# Patient Record
Sex: Male | Born: 1975 | Race: Black or African American | Hispanic: No | Marital: Married | Smoking: Current every day smoker
Health system: Southern US, Community
[De-identification: ages and names within clinical notes are randomized; demographics above are authoritative.]

## PROBLEM LIST (undated history)

## (undated) HISTORY — PX: FRACTURE SURGERY: SHX138

---

## 1998-09-16 ENCOUNTER — Emergency Department (HOSPITAL_COMMUNITY): Admission: EM | Admit: 1998-09-16 | Discharge: 1998-09-16 | Payer: Self-pay | Admitting: Emergency Medicine

## 1999-09-15 ENCOUNTER — Encounter: Payer: Self-pay | Admitting: Emergency Medicine

## 1999-09-15 ENCOUNTER — Emergency Department (HOSPITAL_COMMUNITY): Admission: EM | Admit: 1999-09-15 | Discharge: 1999-09-15 | Payer: Self-pay | Admitting: Emergency Medicine

## 2000-05-17 ENCOUNTER — Emergency Department (HOSPITAL_COMMUNITY): Admission: EM | Admit: 2000-05-17 | Discharge: 2000-05-17 | Payer: Self-pay | Admitting: Emergency Medicine

## 2000-05-17 ENCOUNTER — Encounter: Payer: Self-pay | Admitting: Emergency Medicine

## 2006-04-11 ENCOUNTER — Encounter: Payer: Self-pay | Admitting: Emergency Medicine

## 2007-09-03 ENCOUNTER — Emergency Department (HOSPITAL_COMMUNITY): Admission: EM | Admit: 2007-09-03 | Discharge: 2007-09-03 | Payer: Self-pay | Admitting: *Deleted

## 2008-01-29 ENCOUNTER — Emergency Department (HOSPITAL_COMMUNITY): Admission: EM | Admit: 2008-01-29 | Discharge: 2008-01-29 | Payer: Self-pay | Admitting: Emergency Medicine

## 2008-04-10 ENCOUNTER — Emergency Department (HOSPITAL_COMMUNITY): Admission: EM | Admit: 2008-04-10 | Discharge: 2008-04-10 | Payer: Self-pay | Admitting: Emergency Medicine

## 2008-04-17 ENCOUNTER — Emergency Department (HOSPITAL_COMMUNITY): Admission: EM | Admit: 2008-04-17 | Discharge: 2008-04-17 | Payer: Self-pay | Admitting: Emergency Medicine

## 2010-07-31 ENCOUNTER — Emergency Department (HOSPITAL_COMMUNITY): Admission: EM | Admit: 2010-07-31 | Discharge: 2010-07-31 | Payer: Self-pay | Admitting: Emergency Medicine

## 2014-06-17 ENCOUNTER — Emergency Department (HOSPITAL_COMMUNITY)
Admission: EM | Admit: 2014-06-17 | Discharge: 2014-06-17 | Disposition: A | Discharge status: Home / Self Care | Payer: Self-pay | Attending: Emergency Medicine | Admitting: Emergency Medicine

## 2014-06-17 ENCOUNTER — Emergency Department (HOSPITAL_COMMUNITY): Payer: Self-pay

## 2014-06-17 ENCOUNTER — Encounter (HOSPITAL_COMMUNITY): Payer: Self-pay | Admitting: Emergency Medicine

## 2014-06-17 DIAGNOSIS — IMO0002 Reserved for concepts with insufficient information to code with codable children: Secondary | ICD-10-CM | POA: Insufficient documentation

## 2014-06-17 DIAGNOSIS — S6990XA Unspecified injury of unspecified wrist, hand and finger(s), initial encounter: Principal | ICD-10-CM | POA: Insufficient documentation

## 2014-06-17 DIAGNOSIS — S6991XA Unspecified injury of right wrist, hand and finger(s), initial encounter: Secondary | ICD-10-CM

## 2014-06-17 DIAGNOSIS — Y9389 Activity, other specified: Secondary | ICD-10-CM | POA: Insufficient documentation

## 2014-06-17 DIAGNOSIS — W208XXA Other cause of strike by thrown, projected or falling object, initial encounter: Secondary | ICD-10-CM | POA: Insufficient documentation

## 2014-06-17 DIAGNOSIS — Y929 Unspecified place or not applicable: Secondary | ICD-10-CM | POA: Insufficient documentation

## 2014-06-17 DIAGNOSIS — Z23 Encounter for immunization: Secondary | ICD-10-CM | POA: Insufficient documentation

## 2014-06-17 DIAGNOSIS — F17200 Nicotine dependence, unspecified, uncomplicated: Secondary | ICD-10-CM | POA: Insufficient documentation

## 2014-06-17 DIAGNOSIS — S6980XA Other specified injuries of unspecified wrist, hand and finger(s), initial encounter: Secondary | ICD-10-CM | POA: Insufficient documentation

## 2014-06-17 MED ORDER — IBUPROFEN 800 MG PO TABS
800.0000 mg | ORAL_TABLET | Freq: Once | ORAL | Status: AC
  Administered 2014-06-17: 800 mg via ORAL
  Filled 2014-06-17: qty 1

## 2014-06-17 MED ORDER — TETANUS-DIPHTH-ACELL PERTUSSIS 5-2.5-18.5 LF-MCG/0.5 IM SUSP
0.5000 mL | Freq: Once | INTRAMUSCULAR | Status: AC
  Administered 2014-06-17: 0.5 mL via INTRAMUSCULAR
  Filled 2014-06-17: qty 0.5

## 2014-06-17 MED ORDER — IBUPROFEN 800 MG PO TABS: 800.0000 mg | ORAL_TABLET | Freq: Three times a day (TID) | ORAL | Status: DC

## 2014-06-17 NOTE — ED Notes (Signed)
Pt had R middle and ring finger hurt by alternator this am when it fell on hand. Pt has swelling to those 2 fingers along with abrasions. No injury to palm of hand or other fingers noted. Pt able to move fingers

## 2014-06-17 NOTE — ED Provider Notes (Signed)
CSN: [id number]     Arrival date & time 06/17/14  1308 History  This chart was scribed for non-physician practitioner, Coral CeoJessica Jennilyn Esteve PA-C, working with No att. providers found by Milly JakobJohn Lee Graves, ED Scribe. The patient was seen in room WTR9/WTR9. Patient's care was started at 2:01 PM.   Chief Complaint  Patient presents with  . Finger Injury   The history is provided by the patient. No language interpreter was used.    HPI Comments: Mason Calderon is a 38 y.o. male with no PMH who presents to the Emergency Department complaining of constant, right middle and right ring finger pain. He reports that he was working on his car at 1:00 AM this morning and an alternator fell on his hand. No other injuries or trauma. He reports that the pain is exacerbated by movement and palpation. He denies taking anything for the pain. No numbness, tingling, loss of sensation or weakness. Few minor abrasions with no open wounds or drainage. Tetanus not up to date.    History reviewed. No pertinent past medical history. History reviewed. No pertinent past surgical history. History reviewed. No pertinent family history. History  Substance Use Topics  . Smoking status: Current Every Day Smoker  . Smokeless tobacco: Not on file  . Alcohol Use: Yes    Review of Systems  Constitutional: Negative for fever and chills.  Musculoskeletal: Positive for arthralgias (right middle and ring finger).  Skin: Positive for wound (right middle and ring finger).  Neurological: Negative for weakness and numbness.    Allergies  Review of patient's allergies indicates no known allergies.  Home Medications   Prior to Admission medications   Not on File   Triage Vitals: BP 139/94  Pulse 76  Temp(Src) 98.6 F (37 C) (Oral)  Resp 16  SpO2 100%  Filed Vitals:   06/17/14 1328 06/17/14 1441  BP: 139/94 157/98  Pulse: 76 67  Temp: 98.6 F (37 C)   TempSrc: Oral   Resp: 16 16  SpO2: 100% 100%    Physical Exam   Nursing note and vitals reviewed. Constitutional: He is oriented to person, place, and time. He appears well-developed and well-nourished. No distress.  HENT:  Head: Normocephalic and atraumatic.  Right Ear: External ear normal.  Left Ear: External ear normal.  Mouth/Throat: Oropharynx is clear and moist.  Eyes: Conjunctivae are normal. Right eye exhibits no discharge. Left eye exhibits no discharge.  Neck: Neck supple.  Cardiovascular: Normal rate.   Radial pulses present. Capillary refill <2 seconds on digits of right hand.  Pulmonary/Chest: Effort normal.  Abdominal: Soft.  Musculoskeletal: Normal range of motion. He exhibits edema and tenderness.       Hands: Tenderness to palpation to the right 3rd and 4th digits throughout with mild associated edema throughout. No limitations with ROM of the digits.   Neurological: He is alert and oriented to person, place, and time.  Sensation intact in the digits of right hand  Skin: Skin is warm and dry. He is not diaphoretic.  Few minor abrasions to the 3rd and 4th digits. No open wounds or drainage. No erythema, warmth or ecchymosis    ED Course  Procedures (including critical care time) DIAGNOSTIC STUDIES: Oxygen Saturation is 100% on room air, normal by my interpretation.    COORDINATION OF CARE: 2:10 PM-Discussed treatment plan which includes Advil and cold compress with pt at bedside and pt agreed to plan.   Labs Review Labs Reviewed - No data to display  Imaging Review Dg Hand Complete Right  06/17/2014   CLINICAL DATA:  Right hand injury.  EXAM: RIGHT HAND - COMPLETE 3+ VIEW  COMPARISON:  None.  FINDINGS: There is no evidence of fracture or dislocation. There is no evidence of arthropathy or other focal bone abnormality. Soft tissues are unremarkable.  IMPRESSION: Negative.   Electronically Signed   By: Charlett NoseKevin  Dover M.D.   On: 06/17/2014 13:56     EKG Interpretation None      MDM   Mason Calderon is a 38 y.o. male  with no PMH who presents to the Emergency Department complaining of constant, right middle and right ring finger pain. Pain likely due to contusion. X-rays negative for fracture or malalignment. Patient neurovascularly intact. Wounds cleaned and antibiotic ointment applied. No lacerations to repair. Tetanus updated. Shoulder sling given to help with elevation. Also ice applied in ED. RICE method discussed. BP mildly elevated which I suspected is due to pain. Instructed patient to recheck this as an OP. Return precautions, discharge instructions, and follow-up was discussed with the patient before discharge.     Discharge Medication List as of 06/17/2014  2:20 PM    START taking these medications   Details  ibuprofen (ADVIL,MOTRIN) 800 MG tablet Take 1 tablet (800 mg total) by mouth 3 (three) times daily., Starting 06/17/2014, Until Discontinued, Print        Final impressions: 1. Injury of fourth finger, right, initial encounter   2. Injury of middle finger, right, initial encounter      Luiz IronJessica Katlin Athel Merriweather PA-C   I personally performed the services described in this documentation, which was scribed in my presence. The recorded information has been reviewed and is accurate.        Jillyn LedgerJessica K Kimia Finan, PA-C 06/18/14 [phone]

## 2014-06-17 NOTE — Discharge Instructions (Signed)
Take Ibuprofen to help with pain and swelling  Use RICE method - see below Keep wounds clean and dry - apply antibiotic ointment Return to the emergency department if you develop any changing/worsening condition, drainage, spreading redness/swelling, or any other concerns (please read additional information regarding your condition below)    Musculoskeletal Pain Musculoskeletal pain is muscle and boney aches and pains. These pains can occur in any part of the body. Your caregiver may treat you without knowing the cause of the pain. They may treat you if blood or urine tests, X-rays, and other tests were normal.  CAUSES There is often not a definite cause or reason for these pains. These pains may be caused by a type of germ (virus). The discomfort may also come from overuse. Overuse includes working out too hard when your body is not fit. Boney aches also come from weather changes. Bone is sensitive to atmospheric pressure changes. HOME CARE INSTRUCTIONS   Ask when your test results will be ready. Make sure you get your test results.  Only take over-the-counter or prescription medicines for pain, discomfort, or fever as directed by your caregiver. If you were given medications for your condition, do not drive, operate machinery or power tools, or sign legal documents for 24 hours. Do not drink alcohol. Do not take sleeping pills or other medications that may interfere with treatment.  Continue all activities unless the activities cause more pain. When the pain lessens, slowly resume normal activities. Gradually increase the intensity and duration of the activities or exercise.  During periods of severe pain, bed rest may be helpful. Lay or sit in any position that is comfortable.  Putting ice on the injured area.  Put ice in a bag.  Place a towel between your skin and the bag.  Leave the ice on for 15 to 20 minutes, 3 to 4 times a day.  Follow up with your caregiver for continued  problems and no reason can be found for the pain. If the pain becomes worse or does not go away, it may be necessary to repeat tests or do additional testing. Your caregiver may need to look further for a possible cause. SEEK IMMEDIATE MEDICAL CARE IF:  You have pain that is getting worse and is not relieved by medications.  You develop chest pain that is associated with shortness or breath, sweating, feeling sick to your stomach (nauseous), or throw up (vomit).  Your pain becomes localized to the abdomen.  You develop any new symptoms that seem different or that concern you. MAKE SURE YOU:   Understand these instructions.  Will watch your condition.  Will get help right away if you are not doing well or get worse. Document Released: 11/27/2005 Document Revised: 02/19/2012 Document Reviewed: 08/01/2013 [facility] Patient Information 2015 [place], [place]. This information is not intended to replace advice given to you by your health care provider. Make sure you discuss any questions you have with your health care provider.  RICE: Routine Care for Injuries The routine care of many injuries includes Rest, Ice, Compression, and Elevation (RICE). HOME CARE INSTRUCTIONS Rest is needed to allow your body to heal. Routine activities can usually be resumed when comfortable. Injured tendons and bones can take up to 6 weeks to heal. Tendons are the cord-like structures that attach muscle to bone. Ice following an injury helps keep the swelling down and reduces pain. Put ice in a plastic bag. Place a towel between your skin and the bag. Leave the  ice on for 15-20 minutes, 3-4 times a day, or as directed by your health care provider. Do this while awake, for the first 24 to 48 hours. After that, continue as directed by your caregiver. Compression helps keep swelling down. It also gives support and helps with discomfort. If an elastic bandage has been applied, it should be removed and reapplied every 3  to 4 hours. It should not be applied tightly, but firmly enough to keep swelling down. Watch fingers or toes for swelling, bluish discoloration, coldness, numbness, or excessive pain. If any of these problems occur, remove the bandage and reapply loosely. Contact your caregiver if these problems continue. Elevation helps reduce swelling and decreases pain. With extremities, such as the arms, hands, legs, and feet, the injured area should be placed near or above the level of the heart, if possible. SEEK IMMEDIATE MEDICAL CARE IF: You have persistent pain and swelling. You develop redness, numbness, or unexpected weakness. Your symptoms are getting worse rather than improving after several days. These symptoms may indicate that further evaluation or further X-rays are needed. Sometimes, X-rays may not show a small broken bone (fracture) until 1 week or 10 days later. Make a follow-up appointment with your caregiver. Ask when your X-ray results will be ready. Make sure you get your X-ray results. Document Released: 03/11/2001 Document Revised: 12/02/2013 Document Reviewed: 04/28/2011 [facility] Patient Information 2015 [place], [place]. This information is not intended to replace advice given to you by your health care provider. Make sure you discuss any questions you have with your health care provider.

## 2014-06-18 NOTE — ED Provider Notes (Signed)
Medical screening examination/treatment/procedure(s) were performed by non-physician practitioner and as supervising physician I was immediately available for consultation/collaboration.   EKG Interpretation None        Joya Gaskinsonald W Abbott Jasinski, MD 06/18/14 1921

## 2014-07-03 ENCOUNTER — Emergency Department (HOSPITAL_COMMUNITY)
Admission: EM | Admit: 2014-07-03 | Discharge: 2014-07-03 | Disposition: A | Discharge status: Home / Self Care | Payer: Self-pay | Attending: Emergency Medicine | Admitting: Emergency Medicine

## 2014-07-03 ENCOUNTER — Emergency Department (HOSPITAL_COMMUNITY): Payer: Self-pay

## 2014-07-03 ENCOUNTER — Encounter (HOSPITAL_COMMUNITY): Payer: Self-pay | Admitting: Emergency Medicine

## 2014-07-03 DIAGNOSIS — Z9889 Other specified postprocedural states: Secondary | ICD-10-CM | POA: Insufficient documentation

## 2014-07-03 DIAGNOSIS — Y9289 Other specified places as the place of occurrence of the external cause: Secondary | ICD-10-CM | POA: Insufficient documentation

## 2014-07-03 DIAGNOSIS — W230XXA Caught, crushed, jammed, or pinched between moving objects, initial encounter: Secondary | ICD-10-CM | POA: Insufficient documentation

## 2014-07-03 DIAGNOSIS — S62639A Displaced fracture of distal phalanx of unspecified finger, initial encounter for closed fracture: Secondary | ICD-10-CM | POA: Insufficient documentation

## 2014-07-03 DIAGNOSIS — S62502A Fracture of unspecified phalanx of left thumb, initial encounter for closed fracture: Secondary | ICD-10-CM

## 2014-07-03 DIAGNOSIS — S6980XA Other specified injuries of unspecified wrist, hand and finger(s), initial encounter: Secondary | ICD-10-CM | POA: Insufficient documentation

## 2014-07-03 DIAGNOSIS — S6990XA Unspecified injury of unspecified wrist, hand and finger(s), initial encounter: Secondary | ICD-10-CM | POA: Insufficient documentation

## 2014-07-03 DIAGNOSIS — F17200 Nicotine dependence, unspecified, uncomplicated: Secondary | ICD-10-CM | POA: Insufficient documentation

## 2014-07-03 DIAGNOSIS — Y9389 Activity, other specified: Secondary | ICD-10-CM | POA: Insufficient documentation

## 2014-07-03 MED ORDER — HYDROCODONE-ACETAMINOPHEN 5-325 MG PO TABS
1.0000 | ORAL_TABLET | Freq: Once | ORAL | Status: AC
  Administered 2014-07-03: 1 via ORAL
  Filled 2014-07-03: qty 1

## 2014-07-03 MED ORDER — HYDROCODONE-ACETAMINOPHEN 5-325 MG PO TABS: 1.0000 | ORAL_TABLET | Freq: Four times a day (QID) | ORAL | Status: DC | PRN

## 2014-07-03 NOTE — Discharge Instructions (Signed)
Your have a small fracture at the tip of your thumb You have been placed in a splint and given a referral to a hand surgeon to monitor healing

## 2014-07-03 NOTE — ED Provider Notes (Signed)
CSN: [id number]     Arrival date & time 07/03/14  0147 History   First MD Initiated Contact with Patient 07/03/14 0407     Chief Complaint  Patient presents with  . 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  Since that time.  He had pain and swelling.  Has been laceration to the skin.  He has not taken any medication or tried any treatment for his discomfort  The history is provided by the patient.    History reviewed. No pertinent past medical history. Past Surgical History  Procedure Laterality Date  . Fracture surgery     No family history on file. History  Substance Use Topics  . Smoking status: Current Every Day Smoker -- 0.50 packs/day    Types: Cigarettes  . Smokeless tobacco: Not on file  . Alcohol Use: Yes    Review of Systems  Constitutional: Negative for fever.  Musculoskeletal: Positive for joint swelling.  Neurological: Negative for numbness.  All other systems reviewed and are negative.     Allergies  Mushroom extract complex and Bee venom  Home Medications   Prior to Admission medications   Not on File   BP 161/99  Pulse 73  Temp(Src) 98.1 F (36.7 C) (Oral)  Resp 16  SpO2 97% Physical Exam  Nursing note and vitals reviewed. Constitutional: He appears well-developed and well-nourished.  HENT:  Head: Normocephalic.  Eyes: Pupils are equal, round, and reactive to light.  Neck: Normal range of motion.  Cardiovascular: Normal rate.   Pulmonary/Chest: Effort normal.  Musculoskeletal: He exhibits tenderness. He exhibits no edema.       Hands: Neurological: He is alert.  Skin: Skin is warm. No erythema.    ED Course  Procedures (including critical care time) Labs Review Labs Reviewed - No data to display  Imaging Review Dg Finger Thumb  Left  07/03/2014   CLINICAL DATA:  Thumb injury 3 days ago with persistent pain.  EXAM: LEFT THUMB 2+V  COMPARISON:  None.  FINDINGS: There is a nondisplaced fracture of the distal tuft of the thumb. No intra-articular extension identified. There is no evidence of foreign body.  IMPRESSION: Nondisplaced fracture of the distal phalanx of the left thumb.   Electronically Signed   By: Roxy HorsemanBill  Veazey M.D.   On: 07/03/2014 02:35     EKG Interpretation None      MDM  Small nondisplaced fracture of disatal tip of L thumb Will place in velcro splint  Final diagnoses:  None         Arman FilterGail K Rayma Hegg, NP 07/03/14 [phone]

## 2014-07-03 NOTE — ED Provider Notes (Signed)
Medical screening examination/treatment/procedure(s) were performed by non-physician practitioner and as supervising physician I was immediately available for consultation/collaboration.   EKG Interpretation None        Hoby Kawai, MD 07/03/14 0639 

## 2014-07-03 NOTE — ED Notes (Signed)
Per pt report: pt smashed his left thumb in between two pieces of metal on Tuesday. Pt reports pain has increased.  Pt able to bend thumb and no obvious deformity noted.

## 2015-07-16 ENCOUNTER — Encounter (HOSPITAL_COMMUNITY): Payer: Self-pay

## 2015-07-16 ENCOUNTER — Emergency Department (HOSPITAL_COMMUNITY): Payer: Self-pay

## 2015-07-16 ENCOUNTER — Emergency Department (HOSPITAL_COMMUNITY)
Admission: EM | Admit: 2015-07-16 | Discharge: 2015-07-16 | Disposition: A | Discharge status: Home / Self Care | Payer: Self-pay | Attending: Emergency Medicine | Admitting: Emergency Medicine

## 2015-07-16 DIAGNOSIS — S3992XA Unspecified injury of lower back, initial encounter: Secondary | ICD-10-CM | POA: Insufficient documentation

## 2015-07-16 DIAGNOSIS — Z720 Tobacco use: Secondary | ICD-10-CM | POA: Insufficient documentation

## 2015-07-16 DIAGNOSIS — X58XXXA Exposure to other specified factors, initial encounter: Secondary | ICD-10-CM | POA: Insufficient documentation

## 2015-07-16 DIAGNOSIS — Y9389 Activity, other specified: Secondary | ICD-10-CM | POA: Insufficient documentation

## 2015-07-16 DIAGNOSIS — M545 Low back pain, unspecified: Secondary | ICD-10-CM

## 2015-07-16 DIAGNOSIS — Y998 Other external cause status: Secondary | ICD-10-CM | POA: Insufficient documentation

## 2015-07-16 DIAGNOSIS — S73101A Unspecified sprain of right hip, initial encounter: Secondary | ICD-10-CM | POA: Insufficient documentation

## 2015-07-16 DIAGNOSIS — Y9289 Other specified places as the place of occurrence of the external cause: Secondary | ICD-10-CM | POA: Insufficient documentation

## 2015-07-16 MED ORDER — METHOCARBAMOL 500 MG PO TABS: 500.0000 mg | ORAL_TABLET | Freq: Two times a day (BID) | ORAL | Status: AC

## 2015-07-16 MED ORDER — DICLOFENAC SODIUM 75 MG PO TBEC: 75.0000 mg | DELAYED_RELEASE_TABLET | Freq: Two times a day (BID) | ORAL | Status: AC

## 2015-07-16 NOTE — ED Notes (Signed)
Pt ambulatory with steady gait returned from radiology

## 2015-07-16 NOTE — Discharge Instructions (Signed)

## 2015-07-16 NOTE — ED Notes (Signed)
Patient states he was helping a relative go up stairs yesterday and felt his right hip "pop". Patient states he continues to have pain in his right lower back and right hip. Patient states today he took a step and fell to the ground.

## 2015-07-16 NOTE — ED Provider Notes (Signed)
CSN: [id number]     Arrival date & time 07/16/15  1116 History   This chart was scribed for non-physician practitioner, Langston Masker, PA-C working with Lyndal Pulley, MD, by Jarvis Morgan, ED Scribe. This patient was seen in room WTR6/WTR6 and the patient's care was started at 12:05 PM.    Chief Complaint  Patient presents with  . Fall  . Hip Pain  . Back Pain    Patient is a 39 y.o. male presenting with hip pain and back pain.  Hip Pain This is a new problem. The current episode started yesterday. The problem occurs constantly. The problem has not changed since onset.Pertinent negatives include no chest pain, no abdominal pain, no headaches and no shortness of breath. The symptoms are aggravated by walking and twisting. Nothing relieves the symptoms. He has tried nothing for the symptoms.  Back Pain Associated symptoms: no abdominal pain, no chest pain and no headaches     HPI Comments: Mason Calderon is a 38 y.o. male who presents to the Emergency Department complaining of constant, moderate, right hip pain onset yesterday. Pt states he was helping a relative that is wheel chair bound go up the stairs and felt a "pop" in his right hip. Pt states the pain is radiating up into his right lower back. Pt is able to ambulate but is having to use a cane. He denise any prior h/o hip pain or back pain. He has not taken any medication for the pain. Pt denies any other complaints at this time.      History reviewed. No pertinent past medical history. Past Surgical History  Procedure Laterality Date  . Fracture surgery     History reviewed. No pertinent family history. History  Substance Use Topics  . Smoking status: Current Every Day Smoker -- 0.50 packs/day    Types: Cigarettes  . Smokeless tobacco: Never Used  . Alcohol Use: Yes     Comment: occasionally    Review of Systems  Respiratory: Negative for shortness of breath.   Cardiovascular: Negative for chest pain.   Gastrointestinal: Negative for abdominal pain.  Musculoskeletal: Positive for myalgias, back pain and arthralgias.  Neurological: Negative for headaches.  All other systems reviewed and are negative.     Allergies  Mushroom extract complex and Bee venom  Home Medications   Prior to Admission medications   Medication Sig Start Date End Date Taking? Authorizing Provider  HYDROcodone-acetaminophen (NORCO/VICODIN) 5-325 MG per tablet Take 1 tablet by mouth every 6 (six) hours as needed. Patient not taking: Reported on 07/16/2015 07/03/14   Earley Favor, NP   Triage Vitals: BP 152/104 mmHg  Pulse 89  Temp(Src) 98 F (36.7 C) (Oral)  Resp 20  SpO2 100%  Physical Exam  Constitutional: He is oriented to person, place, and time. He appears well-developed and well-nourished. No distress.  HENT:  Head: Normocephalic and atraumatic.  Eyes: Conjunctivae and EOM are normal.  Neck: Neck supple. No tracheal deviation present.  Cardiovascular: Normal rate.   Pulmonary/Chest: Effort normal. No respiratory distress.  Musculoskeletal: Normal range of motion. He exhibits tenderness.  Tender mid back to lumbar, tender to right hip  Neurological: He is alert and oriented to person, place, and time.  Skin: Skin is warm and dry.  Psychiatric: He has a normal mood and affect. His behavior is normal.  Nursing note and vitals reviewed.   ED Course  Procedures (including critical care time)  DIAGNOSTIC STUDIES: Oxygen Saturation is 100% on RA, normal  by my interpretation.    COORDINATION OF CARE:    Labs Review Labs Reviewed - No data to display  Imaging Review Dg Lumbar Spine Complete  07/16/2015   CLINICAL DATA:  39 year old male with acute onset pain radiating to the right hip after assisting someone in a wheelchair. Symptoms for 1 day. Initial encounter.  EXAM: LUMBAR SPINE - COMPLETE 4+ VIEW  COMPARISON:  None.  FINDINGS: Mildly transitional anatomy. Mostly sacralized S1 level with  sclerotic bilateral assimilation joints and suspected vestigial S1-S2 disc space. Straightening of lumbar lordosis. Lumbar disc spaces are preserved except for L5-S1 with mild narrowing and and endplate spurring. No pars fracture. SI joints appear within normal limits. Visualized lower thoracic levels appear intact. Evidence of mild calcified atherosclerosis of the abdominal aorta.  IMPRESSION: 1. No acute osseous abnormality identified in the lumbar spine Mild evidence of disc and endplate degeneration at L5-S1. 2. Evidence of calcified aortic atherosclerosis.   Electronically Signed   By: Odessa Fleming M.D.   On: 07/16/2015 13:20   Dg Hip Unilat With Pelvis 2-3 Views Right  07/16/2015   CLINICAL DATA:  Acute onset severe pain while assisting with wheelchair transfer  EXAM: DG HIP (WITH OR WITHOUT PELVIS) 2-3V RIGHT  COMPARISON:  None.  FINDINGS: Frontal pelvis as well as frontal and lateral right hip images were obtained. There is no fracture or dislocation. Joint spaces appear intact. No erosive change. There is an unfused apophysis along the superior lateral left acetabulum.  IMPRESSION: No fracture or dislocation.  No appreciable joint space narrowing.   Electronically Signed   By: Bretta Bang III M.D.   On: 07/16/2015 13:20     EKG Interpretation None      MDM   Final diagnoses:  Sprain of right hip, initial encounter  Right-sided low back pain without sciatica    Robaxin voltaren avs Schedule to see the Orthopaedist for recheck in 1 week if pain persist   Elson Areas, PA-C 07/16/15 [address] [place], PA-C 07/16/15 1349  Lyndal Pulley, MD 07/16/15 [phone]

## 2015-08-06 ENCOUNTER — Emergency Department (HOSPITAL_COMMUNITY): Payer: Self-pay

## 2015-08-06 ENCOUNTER — Encounter (HOSPITAL_COMMUNITY): Payer: Self-pay | Admitting: Emergency Medicine

## 2015-08-06 ENCOUNTER — Emergency Department (HOSPITAL_COMMUNITY)
Admission: EM | Admit: 2015-08-06 | Discharge: 2015-08-06 | Disposition: A | Discharge status: Home / Self Care | Payer: Self-pay | Attending: Emergency Medicine | Admitting: Emergency Medicine

## 2015-08-06 DIAGNOSIS — Y9389 Activity, other specified: Secondary | ICD-10-CM | POA: Insufficient documentation

## 2015-08-06 DIAGNOSIS — Z79899 Other long term (current) drug therapy: Secondary | ICD-10-CM | POA: Insufficient documentation

## 2015-08-06 DIAGNOSIS — Z720 Tobacco use: Secondary | ICD-10-CM | POA: Insufficient documentation

## 2015-08-06 DIAGNOSIS — Y9289 Other specified places as the place of occurrence of the external cause: Secondary | ICD-10-CM | POA: Insufficient documentation

## 2015-08-06 DIAGNOSIS — S51832A Puncture wound without foreign body of left forearm, initial encounter: Secondary | ICD-10-CM

## 2015-08-06 DIAGNOSIS — Z23 Encounter for immunization: Secondary | ICD-10-CM | POA: Insufficient documentation

## 2015-08-06 DIAGNOSIS — W228XXA Striking against or struck by other objects, initial encounter: Secondary | ICD-10-CM | POA: Insufficient documentation

## 2015-08-06 DIAGNOSIS — Y998 Other external cause status: Secondary | ICD-10-CM | POA: Insufficient documentation

## 2015-08-06 DIAGNOSIS — Z791 Long term (current) use of non-steroidal anti-inflammatories (NSAID): Secondary | ICD-10-CM | POA: Insufficient documentation

## 2015-08-06 MED ORDER — HYDROCODONE-ACETAMINOPHEN 5-325 MG PO TABS: 2.0000 | ORAL_TABLET | ORAL | Status: AC | PRN

## 2015-08-06 MED ORDER — HYDROCODONE-ACETAMINOPHEN 5-325 MG PO TABS
2.0000 | ORAL_TABLET | Freq: Once | ORAL | Status: AC
  Administered 2015-08-06: 2 via ORAL
  Filled 2015-08-06: qty 2

## 2015-08-06 MED ORDER — TETANUS-DIPHTH-ACELL PERTUSSIS 5-2.5-18.5 LF-MCG/0.5 IM SUSP
0.5000 mL | Freq: Once | INTRAMUSCULAR | Status: AC
Start: 2015-08-06 — End: 2015-08-06
  Administered 2015-08-06: 0.5 mL via INTRAMUSCULAR
  Filled 2015-08-06: qty 0.5

## 2015-08-06 MED ORDER — SULFAMETHOXAZOLE-TRIMETHOPRIM 800-160 MG PO TABS: 1.0000 | ORAL_TABLET | Freq: Two times a day (BID) | ORAL | Status: AC

## 2015-08-06 MED ORDER — AMOXICILLIN-POT CLAVULANATE 875-125 MG PO TABS
1.0000 | ORAL_TABLET | Freq: Once | ORAL | Status: AC
  Administered 2015-08-06: 1 via ORAL
  Filled 2015-08-06: qty 1
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
## 2015-08-06 NOTE — Discharge Instructions (Signed)
Puncture Wound °A puncture wound is an injury that extends through all layers of the skin and into the tissue beneath the skin (subcutaneous tissue). Puncture wounds become infected easily because germs often enter the body and go beneath the skin during the injury. Having a deep wound with a small entrance point makes it difficult for your caregiver to adequately clean the wound. This is especially true if you have stepped on a nail and it has passed through a dirty shoe or other situations where the wound is obviously contaminated. °CAUSES  °Many puncture wounds involve glass, nails, splinters, fish hooks, or other objects that enter the skin (foreign bodies). A puncture wound may also be caused by a human bite or animal bite. °DIAGNOSIS  °A puncture wound is usually diagnosed by your history and a physical exam. You may need to have an X-ray or an ultrasound to check for any foreign bodies still in the wound. °TREATMENT  °· Your caregiver will clean the wound as thoroughly as possible. Depending on the location of the wound, a bandage (dressing) may be applied. °· Your caregiver might prescribe antibiotic medicines. °· You may need a follow-up visit to check on your wound. Follow all instructions as directed by your caregiver. °HOME CARE INSTRUCTIONS  °· Change your dressing once per day, or as directed by your caregiver. If the dressing sticks, it may be removed by soaking the area in water. °· If your caregiver has given you follow-up instructions, it is very important that you return for a follow-up appointment. Not following up as directed could result in a chronic or permanent injury, pain, and disability. °· Only take over-the-counter or prescription medicines for pain, discomfort, or fever as directed by your caregiver. °· If you are given antibiotics, take them as directed. Finish them even if you start to feel better. °You may need a tetanus shot if: °· You cannot remember when you had your last tetanus  shot. °· You have never had a tetanus shot. °If you got a tetanus shot, your arm may swell, get red, and feel warm to the touch. This is common and not a problem. If you need a tetanus shot and you choose not to have one, there is a rare chance of getting tetanus. Sickness from tetanus can be serious. °You may need a rabies shot if an animal bite caused your puncture wound. °SEEK MEDICAL CARE IF:  °· You have redness, swelling, or increasing pain in the wound. °· You have red streaks going away from the wound. °· You notice a bad smell coming from the wound or dressing. °· You have yellowish-white fluid (pus) coming from the wound. °· You are treated with an antibiotic for infection, but the infection is not getting better. °· You notice something in the wound, such as rubber from your shoe, cloth, or another object. °· You have a fever. °· You have severe pain. °· You have difficulty breathing. °· You feel dizzy or faint. °· You cannot stop vomiting. °· You lose feeling, develop numbness, or cannot move a limb below the wound. °· Your symptoms worsen. °MAKE SURE YOU: °· Understand these instructions. °· Will watch your condition. °· Will get help right away if you are not doing well or get worse. °Document Released: 09/06/2005 Document Revised: 02/19/2012 Document Reviewed: 05/16/2011 °Exit[employer]® Patient Information ©2015 [employer]. This information is not intended to replace advice given to you by your health care provider. Make sure you discuss any questions you   have with your health care provider. ° °

## 2016-07-28 ENCOUNTER — Emergency Department (HOSPITAL_COMMUNITY)
Admission: EM | Admit: 2016-07-28 | Discharge: 2016-07-28 | Disposition: A | Discharge status: Home / Self Care | Payer: Self-pay | Attending: Emergency Medicine | Admitting: Emergency Medicine

## 2016-07-28 ENCOUNTER — Encounter (HOSPITAL_COMMUNITY): Payer: Self-pay

## 2016-07-28 DIAGNOSIS — Z791 Long term (current) use of non-steroidal anti-inflammatories (NSAID): Secondary | ICD-10-CM | POA: Insufficient documentation

## 2016-07-28 DIAGNOSIS — K029 Dental caries, unspecified: Secondary | ICD-10-CM | POA: Insufficient documentation

## 2016-07-28 DIAGNOSIS — Z79899 Other long term (current) drug therapy: Secondary | ICD-10-CM | POA: Insufficient documentation

## 2016-07-28 DIAGNOSIS — F17210 Nicotine dependence, cigarettes, uncomplicated: Secondary | ICD-10-CM | POA: Insufficient documentation

## 2016-07-28 DIAGNOSIS — F1290 Cannabis use, unspecified, uncomplicated: Secondary | ICD-10-CM | POA: Insufficient documentation

## 2016-07-28 MED ORDER — KETOROLAC TROMETHAMINE 30 MG/ML IJ SOLN: 30.0000 mg | Freq: Once | INTRAMUSCULAR | Status: DC

## 2016-07-28 MED ORDER — ACETAMINOPHEN 325 MG PO TABS
650.0000 mg | ORAL_TABLET | Freq: Once | ORAL | Status: AC
  Administered 2016-07-28: 650 mg via ORAL
  Filled 2016-07-28: qty 2

## 2016-07-28 MED ORDER — PENICILLIN V POTASSIUM 500 MG PO TABS: 500.0000 mg | ORAL_TABLET | Freq: Four times a day (QID) | ORAL | 0 refills | Status: AC

## 2016-07-28 MED ORDER — BUPIVACAINE-EPINEPHRINE (PF) 0.5% -1:200000 IJ SOLN: 1.8000 mL | Freq: Once | INTRAMUSCULAR | Status: DC

## 2016-07-28 MED ORDER — KETOROLAC TROMETHAMINE 30 MG/ML IJ SOLN
30.0000 mg | Freq: Once | INTRAMUSCULAR | Status: AC
  Administered 2016-07-28: 30 mg via INTRAMUSCULAR
  Filled 2016-07-28: qty 1

## 2016-07-28 MED ORDER — NAPROXEN 500 MG PO TABS: 500.0000 mg | ORAL_TABLET | Freq: Two times a day (BID) | ORAL | 0 refills | Status: AC
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
## 2016-07-28 NOTE — ED Provider Notes (Signed)
[facility]-EMERGENCY DEPT Provider Note   CSN: [id number] Arrival date & time: 07/28/16  1858  By signing my name below, I, Mason Calderon, attest that this documentation has been prepared under the direction and in the presence of Mason Calderon, [place]. Electronically Signed: Javier Dockerobert Ryan Calderon, ER Scribe. 07/22/2016. 7:39 PM.   History   Chief Complaint Chief Complaint  Patient presents with  . Dental Pain    LEFT UPPER   The history is provided by the patient. No language interpreter was used.    HPI Comments: Mason Calderon is a 40 y.o. male who presents to the Emergency Department complaining of left-sided tooth pain for three days. He woke up today from a nap and his face was swollen on the left side. Denies fever or chills. States he has several broken teeth that he is aware of. Has not tried anything for the pain. He states he has a Education officer, communitydentist but has not tried to contact him because he thought the toothache would go away.   History reviewed. No pertinent past medical history.  There are no active problems to display for this patient.   Past Surgical History:  Procedure Laterality Date  . FRACTURE SURGERY       Home Medications    Prior to Admission medications   Medication Sig Start Date End Date Taking? Authorizing Provider  diclofenac (VOLTAREN) 75 MG EC tablet Take 1 tablet (75 mg total) by mouth 2 (two) times daily. Patient not taking: Reported on 08/06/2015 07/16/15   Elson AreasLeslie K Sofia, PA-C  HYDROcodone-acetaminophen (NORCO/VICODIN) 5-325 MG per tablet Take 2 tablets by mouth every 4 (four) hours as needed. 08/06/15   Elson AreasLeslie K Sofia, PA-C  methocarbamol (ROBAXIN) 500 MG tablet Take 1 tablet (500 mg total) by mouth 2 (two) times daily. Patient not taking: Reported on 08/06/2015 07/16/15   Elson AreasLeslie K Sofia, PA-C    Family History Family History  Problem Relation Age of Onset  . Diabetes Mother   . Hypertension Mother   . Hypertension Father   . Diabetes Father     Social  History Social History  Substance Use Topics  . Smoking status: Current Every Day Smoker    Packs/day: 0.50    Types: Cigarettes  . Smokeless tobacco: Never Used  . Alcohol use Yes     Comment: occasionally     Allergies   Mushroom extract complex and Bee venom   Review of Systems Review of Systems At least 10pt or greater review of systems completed and are negative except where specified in the HPI.  Physical Exam Updated Vital Signs BP 115/81 (BP Location: Left Arm)   Pulse 89   Temp 99.7 F (37.6 C) (Oral)   Resp 16   Ht 6\' 3"  (1.905 m)   Wt 240 lb (108.9 kg)   SpO2 100%   BMI 30.00 kg/m   Physical Exam  Constitutional: He is oriented to person, place, and time. He appears well-developed and well-nourished. No distress.  HENT:  Head: Normocephalic and atraumatic.  Generally poor dentition with several broken and decaying teeth. Left upper quadrant diffusely tender to palpation. No gross abscess. Uvula midline No trismus Left maxillary facial area tender to palpation with some mild edema no erythema No cervical LAD  Eyes: Pupils are equal, round, and reactive to light.  Neck: Neck supple.  Cardiovascular: Normal rate.   Pulmonary/Chest: Effort normal. No respiratory distress.  Musculoskeletal: Normal range of motion.  Neurological: He is alert and oriented to  person, place, and time. Coordination normal.  Skin: Skin is warm and dry. He is not diaphoretic.  Psychiatric: He has a normal mood and affect. His behavior is normal.  Nursing note and vitals reviewed.  ED Treatments / Results  DIAGNOSTIC STUDIES: Oxygen Saturation is 100% on RA, normal by my interpretation.     Labs (all labs ordered are listed, but only abnormal results are displayed) Labs Reviewed - No data to display  EKG  EKG Interpretation None       Radiology No results found.  Procedures Procedures (including critical care time)  Medications Ordered in ED Medications    ketorolac (TORADOL) 30 MG/ML injection 30 mg (30 mg Intramuscular Given 07/28/16 1958)  acetaminophen (TYLENOL) tablet 650 mg (650 mg Oral Given 07/28/16 1958)     Initial Impression / Assessment and Plan / ED Course  I have reviewed the triage vital signs and the nursing notes.  Pertinent labs & imaging results that were available during my care of the patient were reviewed by me and considered in my medical decision making (see chart for details).  Clinical Course    Patient with dentalgia.  No abscess requiring immediate incision and drainage.  Exam not concerning for Ludwig's angina or pharyngeal abscess.  Will treat with Pen VK and naproxen. Low grade fever but otherwise hemodynamically stable, no airway involvement. Pt instructed to follow-up with dentist.  Resource guide given. Discussed return precautions. Pt safe for discharge.   Final Clinical Impressions(s) / ED Diagnoses   Final diagnoses:  Pain due to dental caries    New Prescriptions Discharge Medication List as of 07/28/2016  8:06 PM    START taking these medications   Details  naproxen (NAPROSYN) 500 MG tablet Take 1 tablet (500 mg total) by mouth 2 (two) times daily., Starting Fri 07/28/2016, Print    penicillin v potassium (VEETID) 500 MG tablet Take 1 tablet (500 mg total) by mouth 4 (four) times daily., Starting Fri 07/28/2016, Until Fri 08/04/2016, Print        I personally performed the services described in this documentation, which was scribed in my presence. The recorded information has been reviewed and is accurate.    Carlene CoriaSerena Y Antoinette Haskett, PA-C 07/28/16 2132    Pricilla LovelessScott Goldston, MD 07/29/16 [phone]

## 2016-07-28 NOTE — Discharge Instructions (Signed)
Take antibiotics as prescribed. Take naproxen as needed for pain. Call the dental clinics listed in the attached guide.
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
## 2023-12-01 ENCOUNTER — Encounter (HOSPITAL_COMMUNITY): Payer: Self-pay

## 2023-12-01 ENCOUNTER — Other Ambulatory Visit: Payer: Self-pay

## 2023-12-01 ENCOUNTER — Emergency Department (HOSPITAL_COMMUNITY)
Admission: EM | Admit: 2023-12-01 | Discharge: 2023-12-01 | Disposition: A | Discharge status: Home / Self Care | Payer: 59 | Attending: Emergency Medicine | Admitting: Emergency Medicine

## 2023-12-01 DIAGNOSIS — S01411A Laceration without foreign body of right cheek and temporomandibular area, initial encounter: Secondary | ICD-10-CM | POA: Diagnosis not present

## 2023-12-01 DIAGNOSIS — E669 Obesity, unspecified: Secondary | ICD-10-CM | POA: Diagnosis not present

## 2023-12-01 DIAGNOSIS — Z23 Encounter for immunization: Secondary | ICD-10-CM | POA: Diagnosis not present

## 2023-12-01 DIAGNOSIS — Z6830 Body mass index (BMI) 30.0-30.9, adult: Secondary | ICD-10-CM | POA: Insufficient documentation

## 2023-12-01 DIAGNOSIS — W228XXA Striking against or struck by other objects, initial encounter: Secondary | ICD-10-CM | POA: Insufficient documentation

## 2023-12-01 DIAGNOSIS — S0181XA Laceration without foreign body of other part of head, initial encounter: Secondary | ICD-10-CM | POA: Diagnosis not present

## 2023-12-01 MED ORDER — BACITRACIN ZINC 500 UNIT/GM EX OINT
TOPICAL_OINTMENT | CUTANEOUS | Status: AC
  Filled 2023-12-01: qty 1.8

## 2023-12-01 MED ORDER — BACITRACIN ZINC 500 UNIT/GM EX OINT
TOPICAL_OINTMENT | CUTANEOUS | Status: AC
  Administered 2023-12-01: 1
  Filled 2023-12-01: qty 0.9

## 2023-12-01 MED ORDER — LIDOCAINE-EPINEPHRINE (PF) 2 %-1:200000 IJ SOLN
20.0000 mL | Freq: Once | INTRAMUSCULAR | Status: AC
  Administered 2023-12-01: 20 mL
  Filled 2023-12-01: qty 20

## 2023-12-01 MED ORDER — TETANUS-DIPHTH-ACELL PERTUSSIS 5-2.5-18.5 LF-MCG/0.5 IM SUSY
0.5000 mL | PREFILLED_SYRINGE | Freq: Once | INTRAMUSCULAR | Status: AC
  Administered 2023-12-01: 0.5 mL via INTRAMUSCULAR
  Filled 2023-12-01 (×2): qty 0.5

## 2023-12-01 NOTE — ED Provider Notes (Signed)
[place] EMERGENCY DEPARTMENT AT [facility] Provider Note   CSN: [id number] Arrival date & time: 12/01/23  2536     History  Chief Complaint  Patient presents with   Facial Injury    Mason Calderon is a 47 y.o. male.  HPI     This is a 47 year old with a laceration to the right cheek.  He overall nontoxic-appearing.  ABCs are intact.  Patient reports that he was trying to get into his car when he slipped and his right cheek hit the door.  He sustained a laceration.  He had glasses on no loss of consciousness.  No blood thinners.  Home Medications Prior to Admission medications   Medication Sig Start Date End Date Taking? Authorizing Provider  diclofenac (VOLTAREN) 75 MG EC tablet Take 1 tablet (75 mg total) by mouth 2 (two) times daily. Patient not taking: Reported on 08/06/2015 07/16/15   Elson Areas, PA-C  HYDROcodone-acetaminophen (NORCO/VICODIN) 5-325 MG per tablet Take 2 tablets by mouth every 4 (four) hours as needed. 08/06/15   Elson Areas, PA-C  methocarbamol (ROBAXIN) 500 MG tablet Take 1 tablet (500 mg total) by mouth 2 (two) times daily. Patient not taking: Reported on 08/06/2015 07/16/15   Elson Areas, PA-C  naproxen (NAPROSYN) 500 MG tablet Take 1 tablet (500 mg total) by mouth 2 (two) times daily. 07/28/16   Eliseo Squires, PA-C      Allergies    Mushroom extract complex (do not select) and Bee venom    Review of Systems   Review of Systems  Skin:  Positive for wound.  All other systems reviewed and are negative.   Physical Exam Updated Vital Signs BP (!) 165/96   Pulse 98   Temp 98.3 F (36.8 C) (Oral)   Resp 18   Ht 1.905 m (6\' 3" )   Wt 108.9 kg   SpO2 99%   BMI 30.01 kg/m  Physical Exam Vitals and nursing note reviewed.  Constitutional:      Appearance: He is well-developed. He is obese. He is not ill-appearing.  HENT:     Head: Normocephalic.  Eyes:     Extraocular Movements: Extraocular movements intact.      Pupils: Pupils are equal, round, and reactive to light.      Comments: 1.5 cm slightly gaping laceration just inferior and lateral to the right orbit, slight swelling noted No injected conjunctiva vision grossly intact, extraocular movements intact  Cardiovascular:     Rate and Rhythm: Normal rate and regular rhythm.  Pulmonary:     Effort: Pulmonary effort is normal. No respiratory distress.  Abdominal:     Palpations: Abdomen is soft.  Musculoskeletal:     Cervical back: Neck supple.  Lymphadenopathy:     Cervical: No cervical adenopathy.  Skin:    General: Skin is warm and dry.  Neurological:     Mental Status: He is alert and oriented to person, place, and time.  Psychiatric:        Mood and Affect: Mood normal.     ED Results / Procedures / Treatments   Labs (all labs ordered are listed, but only abnormal results are displayed) Labs Reviewed - No data to display  EKG None  Radiology No results found.  Procedures .Laceration Repair  Date/Time: 12/01/2023 6:03 AM  Performed by: Shon Baton, MD Authorized by: Shon Baton, MD   Consent:    Consent obtained:  Verbal   Consent given  by:  Patient   Risks, benefits, and alternatives were discussed: yes     Risks discussed:  Infection, pain and poor cosmetic result   Alternatives discussed:  No treatment Anesthesia:    Anesthesia method:  Local infiltration   Local anesthetic:  Lidocaine 2% WITH epi Laceration details:    Location:  Face   Face location:  R cheek   Length (cm):  1.5   Depth (mm):  3 Exploration:    Limited defect created (wound extended): no     Hemostasis achieved with:  Direct pressure   Contaminated: no   Treatment:    Area cleansed with:  Povidone-iodine   Amount of cleaning:  Standard   Irrigation solution:  Sterile saline   Visualized foreign bodies/material removed: no     Debridement:  None   Undermining:  None   Scar revision: no   Skin repair:    Repair method:   Sutures   Suture size:  5-0   Suture material:  Fast-absorbing gut   Suture technique:  Simple interrupted   Number of sutures:  3 Approximation:    Approximation:  Close Repair type:    Repair type:  Simple Post-procedure details:    Dressing:  Open (no dressing)   Procedure completion:  Tolerated     Medications Ordered in ED Medications  lidocaine-EPINEPHrine (XYLOCAINE W/EPI) 2 %-1:200000 (PF) injection 20 mL (20 mLs Infiltration Given 12/01/23 0546)  Tdap (BOOSTRIX) injection 0.5 mL (0.5 mLs Intramuscular Given 12/01/23 0545)    ED Course/ Medical Decision Making/ A&P                                 Medical Decision Making Risk Prescription drug management.   This patient presents to the ED for concern of laceration, this involves an extensive number of treatment options, and is a complaint that carries with it a high risk of complications and morbidity.  I considered the following differential and admission for this acute, potentially life threatening condition.  The differential diagnosis includes laceration, contusion, orbital injury, fracture  MDM:    This is a 47 year old male who presents with laceration to the right cheek just adjacent and inferior to the right eye.  Slight swelling noted.  No palpable underlying defects, extraocular movements intact and patient endorses normal vision.  No obvious orbital trauma.  Laceration repaired at bedside.  Do not feel he needs imaging at this time as I have low suspicion for fracture or head injury.  (Labs, imaging, consults)  Labs: I Ordered, and personally interpreted labs.  The pertinent results include: None  Imaging Studies ordered: I ordered imaging studies including none I independently visualized and interpreted imaging. I agree with the radiologist interpretation  Additional history obtained from chart review.  External records from outside source obtained and reviewed including prior evaluations  Cardiac  Monitoring: The patient was not maintained on a cardiac monitor.  If on the cardiac monitor, I personally viewed and interpreted the cardiac monitored which showed an underlying rhythm of: N/A  Reevaluation: After the interventions noted above, I reevaluated the patient and found that they have :improved  Social Determinants of Health:  lives independently  Disposition: Discharge  Co morbidities that complicate the patient evaluation History reviewed. No pertinent past medical history.   Medicines Meds ordered this encounter  Medications   lidocaine-EPINEPHrine (XYLOCAINE W/EPI) 2 %-1:200000 (PF) injection 20 mL   Tdap (BOOSTRIX) injection  0.5 mL   bacitracin 500 UNIT/GM ointment    Carolin Coy, Gabriela: cabinet override    I have reviewed the patients home medicines and have made adjustments as needed  Problem List / ED Course: Problem List Items Addressed This Visit   None Visit Diagnoses       Facial laceration, initial encounter    -  Primary                   Final Clinical Impression(s) / ED Diagnoses Final diagnoses:  Facial laceration, initial encounter    Rx / DC Orders ED Discharge Orders     None         Shon Baton, MD 12/01/23 [phone]

## 2023-12-01 NOTE — ED Triage Notes (Addendum)
Pt reports with a under right eye small laceration from a car door. Pt denies falling. Pt has been drinking tonight. No vision issues.

## 2023-12-01 NOTE — Discharge Instructions (Signed)
You were seen today and have a laceration of the right cheek just under the eye.  Absorbable sutures were placed.  Keep moist with bacitracin ointment.  Sutures will loosen on their own.  Monitor for signs and symptoms of infection including redness.  Once healed, make sure to use sunscreen to prevent significant discoloration.

## 2023-12-17 ENCOUNTER — Telehealth: Payer: Self-pay

## 2023-12-17 NOTE — Progress Notes (Signed)
 Transition Care Management Unsuccessful Follow-up Telephone Call  Date of discharge and from where:  Mason Calderon 12/21  Attempts:  1st Attempt  Reason for unsuccessful TCM follow-up call:  No answer/busy   [name]  [facility], [facility] Guide, Phone: [phone] Website: [email or web address]

## 2023-12-17 NOTE — Progress Notes (Signed)
 Transition Care Management Unsuccessful Follow-up Telephone Call  Date of discharge and from where:  [facility] 12/[facility]  Attempts:  2nd Attempt  Reason for unsuccessful TCM follow-up call:  No answer/busy   Jon Colt [place]  [facility], [facility] Guide, Phone: [phone] Website: [email or web address]

## 2024-01-20 ENCOUNTER — Ambulatory Visit
Admission: EM | Admit: 2024-01-20 | Discharge: 2024-01-20 | Disposition: A | Discharge status: Home / Self Care | Payer: 59 | Attending: Family Medicine | Admitting: Family Medicine

## 2024-01-20 DIAGNOSIS — S0501XA Injury of conjunctiva and corneal abrasion without foreign body, right eye, initial encounter: Secondary | ICD-10-CM | POA: Diagnosis not present

## 2024-01-20 MED ORDER — OFLOXACIN 0.3 % OP SOLN: 1.0000 [drp] | Freq: Four times a day (QID) | OPHTHALMIC | 0 refills | Status: AC

## 2024-01-20 NOTE — ED Provider Notes (Signed)
[facility]    CSN: [id number] Arrival date & time: 01/20/24  1353      History   Chief Complaint Chief Complaint  Patient presents with   Eye Problem    HPI Mason Calderon is a 48 y.o. male presents for eye pain.  Patient reports a couple days ago something blew into his right eye and has since then he has felt like there is something in his eye and he is having difficulty opening his eye.  Endorses watery drainage and foreign body sensation with photosensitivity.  He does wear glasses.  No history of eye surgeries in the past.  No over-the-counter medications have been used since onset.  No other concerns at this time.   Eye Problem Associated symptoms: discharge and photophobia     No past medical history on file.  There are no active problems to display for this patient.   Past Surgical History:  Procedure Laterality Date   FRACTURE SURGERY         Home Medications    Prior to Admission medications   Medication Sig Start Date End Date Taking? Authorizing Provider  ofloxacin  (OCUFLOX ) 0.3 % ophthalmic solution Place 1 drop into the right eye 4 (four) times daily for 7 days. 01/20/24 01/27/24 Yes Keil Pickering, Jodi R, NP  diclofenac  (VOLTAREN ) 75 MG EC tablet Take 1 tablet (75 mg total) by mouth 2 (two) times daily. Patient not taking: Reported on 08/06/2015 07/16/15   Sofia, Leslie K, PA-C  HYDROcodone -acetaminophen  (NORCO/VICODIN) 5-325 MG per tablet Take 2 tablets by mouth every 4 (four) hours as needed. 08/06/15   Sofia, Leslie K, PA-C  methocarbamol  (ROBAXIN ) 500 MG tablet Take 1 tablet (500 mg total) by mouth 2 (two) times daily. Patient not taking: Reported on 08/06/2015 07/16/15   Sofia, Leslie K, PA-C  naproxen  (NAPROSYN ) 500 MG tablet Take 1 tablet (500 mg total) by mouth 2 (two) times daily. 07/28/16   Essie Stephens GRADE, PA-C    Family History Family History  Problem Relation Age of Onset   Diabetes Mother    Hypertension Mother    Hypertension Father     Diabetes Father     Social History Social History   Tobacco Use   Smoking status: Every Day    Current packs/day: 0.50    Types: Cigarettes   Smokeless tobacco: Never  Substance Use Topics   Alcohol use: Yes    Comment: occasionally   Drug use: Yes    Types: Marijuana    Comment: 1-2 times a week     Allergies   Mushroom extract complex (obsolete) and Bee venom   Review of Systems Review of Systems  Eyes:  Positive for photophobia and discharge.     Physical Exam Triage Vital Signs ED Triage Vitals  Encounter Vitals Group     BP 01/20/24 1500 (!) 169/109     Systolic BP Percentile --      Diastolic BP Percentile --      Pulse Rate 01/20/24 1500 79     Resp 01/20/24 1500 18     Temp 01/20/24 1500 99.1 F (37.3 C)     Temp Source 01/20/24 1500 Oral     SpO2 01/20/24 1500 96 %     Weight --      Height --      Head Circumference --      Peak Flow --      Pain Score 01/20/24 1459 8     Pain  Loc --      Pain Education --      Exclude from Growth Chart --    No data found.  Updated Vital Signs BP (!) 169/109 (BP Location: Left Arm)   Pulse 79   Temp 99.1 F (37.3 C) (Oral)   Resp 18   SpO2 96%   Visual Acuity Right Eye Distance: 20/401 (with glasses) Left Eye Distance: 20/50 (with glasses) Bilateral Distance: 20/40 (with glasses)  Right Eye Near:   Left Eye Near:    Bilateral Near:     Physical Exam Vitals and nursing note reviewed.  Constitutional:      General: He is not in acute distress.    Appearance: Normal appearance. He is not ill-appearing.  HENT:     Head: Normocephalic and atraumatic.  Eyes:     General:        Right eye: Discharge present. No foreign body or hordeolum.     Conjunctiva/sclera:     Right eye: Right conjunctiva is injected. No chemosis, exudate or hemorrhage.    Pupils: Pupils are equal, round, and reactive to light.     Right eye: Corneal abrasion and fluorescein uptake present.      Comments: Slight  swelling of the upper eyelid without erythema.  Positive corneal abrasion to the right eye.  Cardiovascular:     Rate and Rhythm: Normal rate.  Pulmonary:     Effort: Pulmonary effort is normal.  Skin:    General: Skin is warm and dry.  Neurological:     General: No focal deficit present.     Mental Status: He is alert and oriented to person, place, and time.  Psychiatric:        Mood and Affect: Mood normal.        Behavior: Behavior normal.      UC Treatments / Results  Labs (all labs ordered are listed, but only abnormal results are displayed) Labs Reviewed - No data to display  EKG   Radiology No results found.  Procedures Procedures (including critical care time)  Medications Ordered in UC Medications - No data to display  Initial Impression / Assessment and Plan / UC Course  I have reviewed the triage vital signs and the nursing notes.  Pertinent labs & imaging results that were available during my care of the patient were reviewed by me and considered in my medical decision making (see chart for details).     Reviewed exam and symptoms with patient.  No red flags.  Positive fluorescein uptake.  Eye was irrigated as well at patient request.  No foreign body on exam.  Will start ofloxacin  antibiotic eyedrops as prescribed.  Advise ophthalmology follow-up in 2 days for recheck.  Strict ER precautions reviewed and patient verbalized understanding. Final Clinical Impressions(s) / UC Diagnoses   Final diagnoses:  Abrasion of right cornea, initial encounter     Discharge Instructions      Start antibiotic eyedrops as prescribed.  Please follow-up with your ophthalmologist in 2 days for recheck.  Please go to the ER if you develop any worsening symptoms.  Hope you feel better soon!     ED Prescriptions     Medication Sig Dispense Auth. Provider   ofloxacin  (OCUFLOX ) 0.3 % ophthalmic solution Place 1 drop into the right eye 4 (four) times daily for 7 days. 5  mL Coda Filler, Jodi R, NP      PDMP not reviewed this encounter.   Loreda Myla SAUNDERS, NP 01/20/24  1543  

## 2024-01-20 NOTE — Discharge Instructions (Addendum)
 Start antibiotic eyedrops as prescribed.  Please follow-up with your ophthalmologist in 2 days for recheck.  Please go to the ER if you develop any worsening symptoms.  Hope you feel better soon!

## 2024-01-20 NOTE — ED Triage Notes (Signed)
 Pt presents with swelling to the rt eye. Pt states he woke up with his eye swollen shut. Pt states it hurts to open his eyes
